# Patient Record
Sex: Female | Born: 1955
Health system: Southern US, Community
[De-identification: ages and names within clinical notes are randomized; demographics above are authoritative.]

## PROBLEM LIST (undated history)

## (undated) DIAGNOSIS — L120 Bullous pemphigoid: Secondary | ICD-10-CM

## (undated) DIAGNOSIS — J302 Other seasonal allergic rhinitis: Secondary | ICD-10-CM

## (undated) DIAGNOSIS — L109 Pemphigus, unspecified: Secondary | ICD-10-CM

## (undated) DIAGNOSIS — Z833 Family history of diabetes mellitus: Secondary | ICD-10-CM

## (undated) DIAGNOSIS — I10 Essential (primary) hypertension: Secondary | ICD-10-CM

## (undated) DIAGNOSIS — M1990 Unspecified osteoarthritis, unspecified site: Secondary | ICD-10-CM

## (undated) DIAGNOSIS — E039 Hypothyroidism, unspecified: Secondary | ICD-10-CM

## (undated) DIAGNOSIS — N906 Unspecified hypertrophy of vulva: Secondary | ICD-10-CM

## (undated) DIAGNOSIS — Z6841 Body Mass Index (BMI) 40.0 and over, adult: Secondary | ICD-10-CM

## (undated) HISTORY — DX: Other seasonal allergic rhinitis: J30.2

## (undated) HISTORY — DX: Body Mass Index (BMI) 40.0 and over, adult: Z684

## (undated) HISTORY — DX: Family history of diabetes mellitus: Z83.3

## (undated) HISTORY — DX: Pemphigus, unspecified: L10.9

## (undated) HISTORY — DX: Hypothyroidism, unspecified: E03.9

## (undated) HISTORY — PX: TONSILLECTOMY: SUR1361

## (undated) HISTORY — DX: Bullous pemphigoid: L12.0

## (undated) HISTORY — DX: Essential (primary) hypertension: I10

## (undated) HISTORY — DX: Unspecified osteoarthritis, unspecified site: M19.90

## (undated) HISTORY — DX: Morbid (severe) obesity due to excess calories: E66.01

---
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
## 2004-08-29 ENCOUNTER — Encounter: Admission: RE | Admit: 2004-08-29 | Discharge: 2004-08-29 | Payer: Self-pay | Admitting: Obstetrics and Gynecology

## 2004-09-06 ENCOUNTER — Encounter: Admission: RE | Admit: 2004-09-06 | Discharge: 2004-09-06 | Payer: Self-pay | Admitting: Obstetrics and Gynecology

## 2006-04-30 ENCOUNTER — Other Ambulatory Visit: Admission: RE | Admit: 2006-04-30 | Discharge: 2006-04-30 | Payer: Self-pay | Admitting: Family Medicine

## 2006-05-12 ENCOUNTER — Encounter: Admission: RE | Admit: 2006-05-12 | Discharge: 2006-05-12 | Payer: Self-pay | Admitting: Family Medicine

## 2011-07-14 ENCOUNTER — Other Ambulatory Visit: Payer: Self-pay | Admitting: Family Medicine

## 2011-07-14 DIAGNOSIS — Z1231 Encounter for screening mammogram for malignant neoplasm of breast: Secondary | ICD-10-CM

## 2011-07-25 ENCOUNTER — Ambulatory Visit
Admission: RE | Admit: 2011-07-25 | Discharge: 2011-07-25 | Disposition: A | Discharge status: Home / Self Care | Payer: 59 | Source: Ambulatory Visit | Attending: Family Medicine | Admitting: Family Medicine

## 2011-07-25 DIAGNOSIS — Z1231 Encounter for screening mammogram for malignant neoplasm of breast: Secondary | ICD-10-CM

## 2012-07-02 ENCOUNTER — Other Ambulatory Visit: Payer: Self-pay | Admitting: Family Medicine

## 2012-07-02 DIAGNOSIS — Z1231 Encounter for screening mammogram for malignant neoplasm of breast: Secondary | ICD-10-CM

## 2012-07-30 ENCOUNTER — Ambulatory Visit
Admission: RE | Admit: 2012-07-30 | Discharge: 2012-07-30 | Disposition: A | Discharge status: Home / Self Care | Payer: 59 | Source: Ambulatory Visit | Attending: Family Medicine | Admitting: Family Medicine

## 2012-07-30 DIAGNOSIS — Z1231 Encounter for screening mammogram for malignant neoplasm of breast: Secondary | ICD-10-CM

## 2013-07-26 ENCOUNTER — Other Ambulatory Visit: Payer: Self-pay

## 2013-07-26 DIAGNOSIS — Z1231 Encounter for screening mammogram for malignant neoplasm of breast: Secondary | ICD-10-CM

## 2013-08-03 ENCOUNTER — Ambulatory Visit
Admission: RE | Admit: 2013-08-03 | Discharge: 2013-08-03 | Disposition: A | Discharge status: Home / Self Care | Payer: 59 | Source: Ambulatory Visit

## 2013-08-03 DIAGNOSIS — Z1231 Encounter for screening mammogram for malignant neoplasm of breast: Secondary | ICD-10-CM

## 2016-06-10 ENCOUNTER — Other Ambulatory Visit: Payer: Self-pay | Admitting: Family Medicine

## 2016-06-10 ENCOUNTER — Ambulatory Visit
Admission: RE | Admit: 2016-06-10 | Discharge: 2016-06-10 | Disposition: A | Discharge status: Home / Self Care | Payer: BLUE CROSS/BLUE SHIELD | Source: Ambulatory Visit | Attending: Family Medicine | Admitting: Family Medicine

## 2016-06-10 DIAGNOSIS — M25552 Pain in left hip: Secondary | ICD-10-CM

## 2016-07-04 DIAGNOSIS — N906 Unspecified hypertrophy of vulva: Secondary | ICD-10-CM | POA: Insufficient documentation

## 2016-07-04 HISTORY — DX: Unspecified hypertrophy of vulva: N90.60

## 2017-08-29 IMAGING — CR DG HIP (WITH OR WITHOUT PELVIS) 2-3V*L*
2 series · 2 of 2 positions shown · non-contrast
Comparison: None in PACs

CLINICAL DATA: Chronic left hip pain for the past year with
increased symptoms over the past 2 weeks without known trauma.

EXAM:
DG HIP (WITH OR WITHOUT PELVIS) 2-3V LEFT

[w hip ap left]
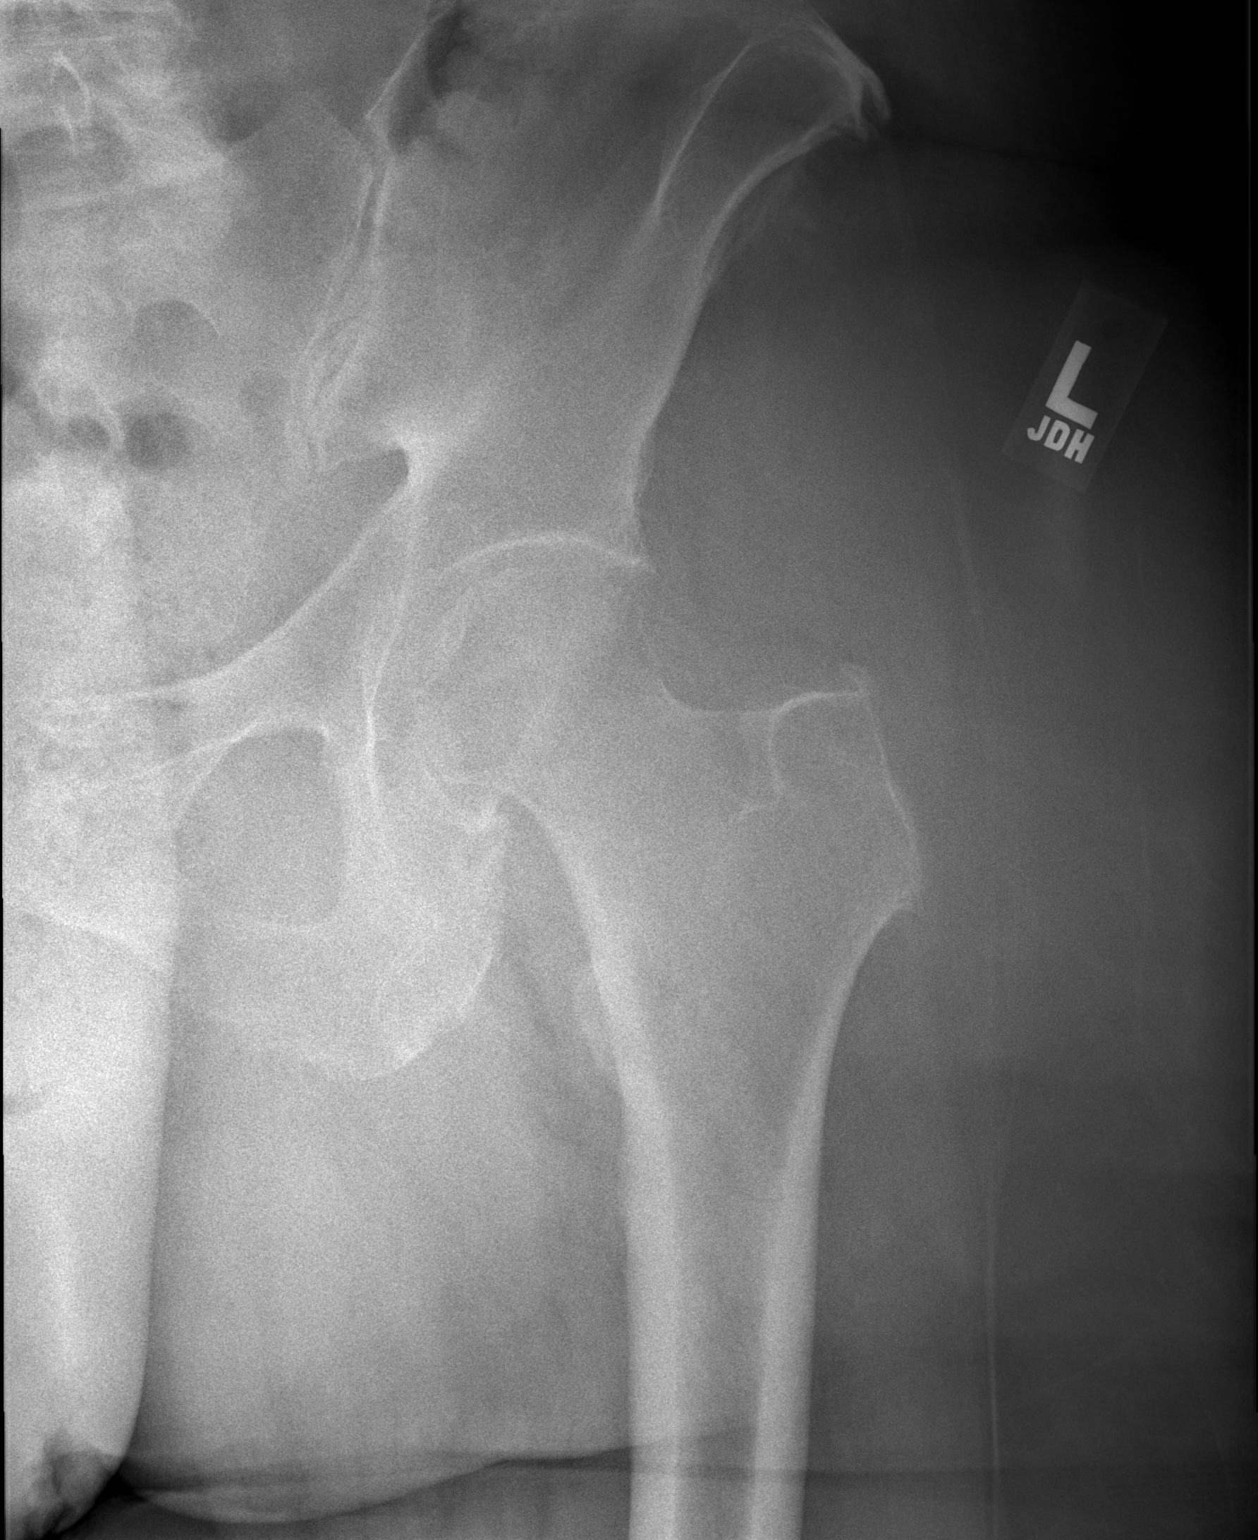

[w hip lat left]
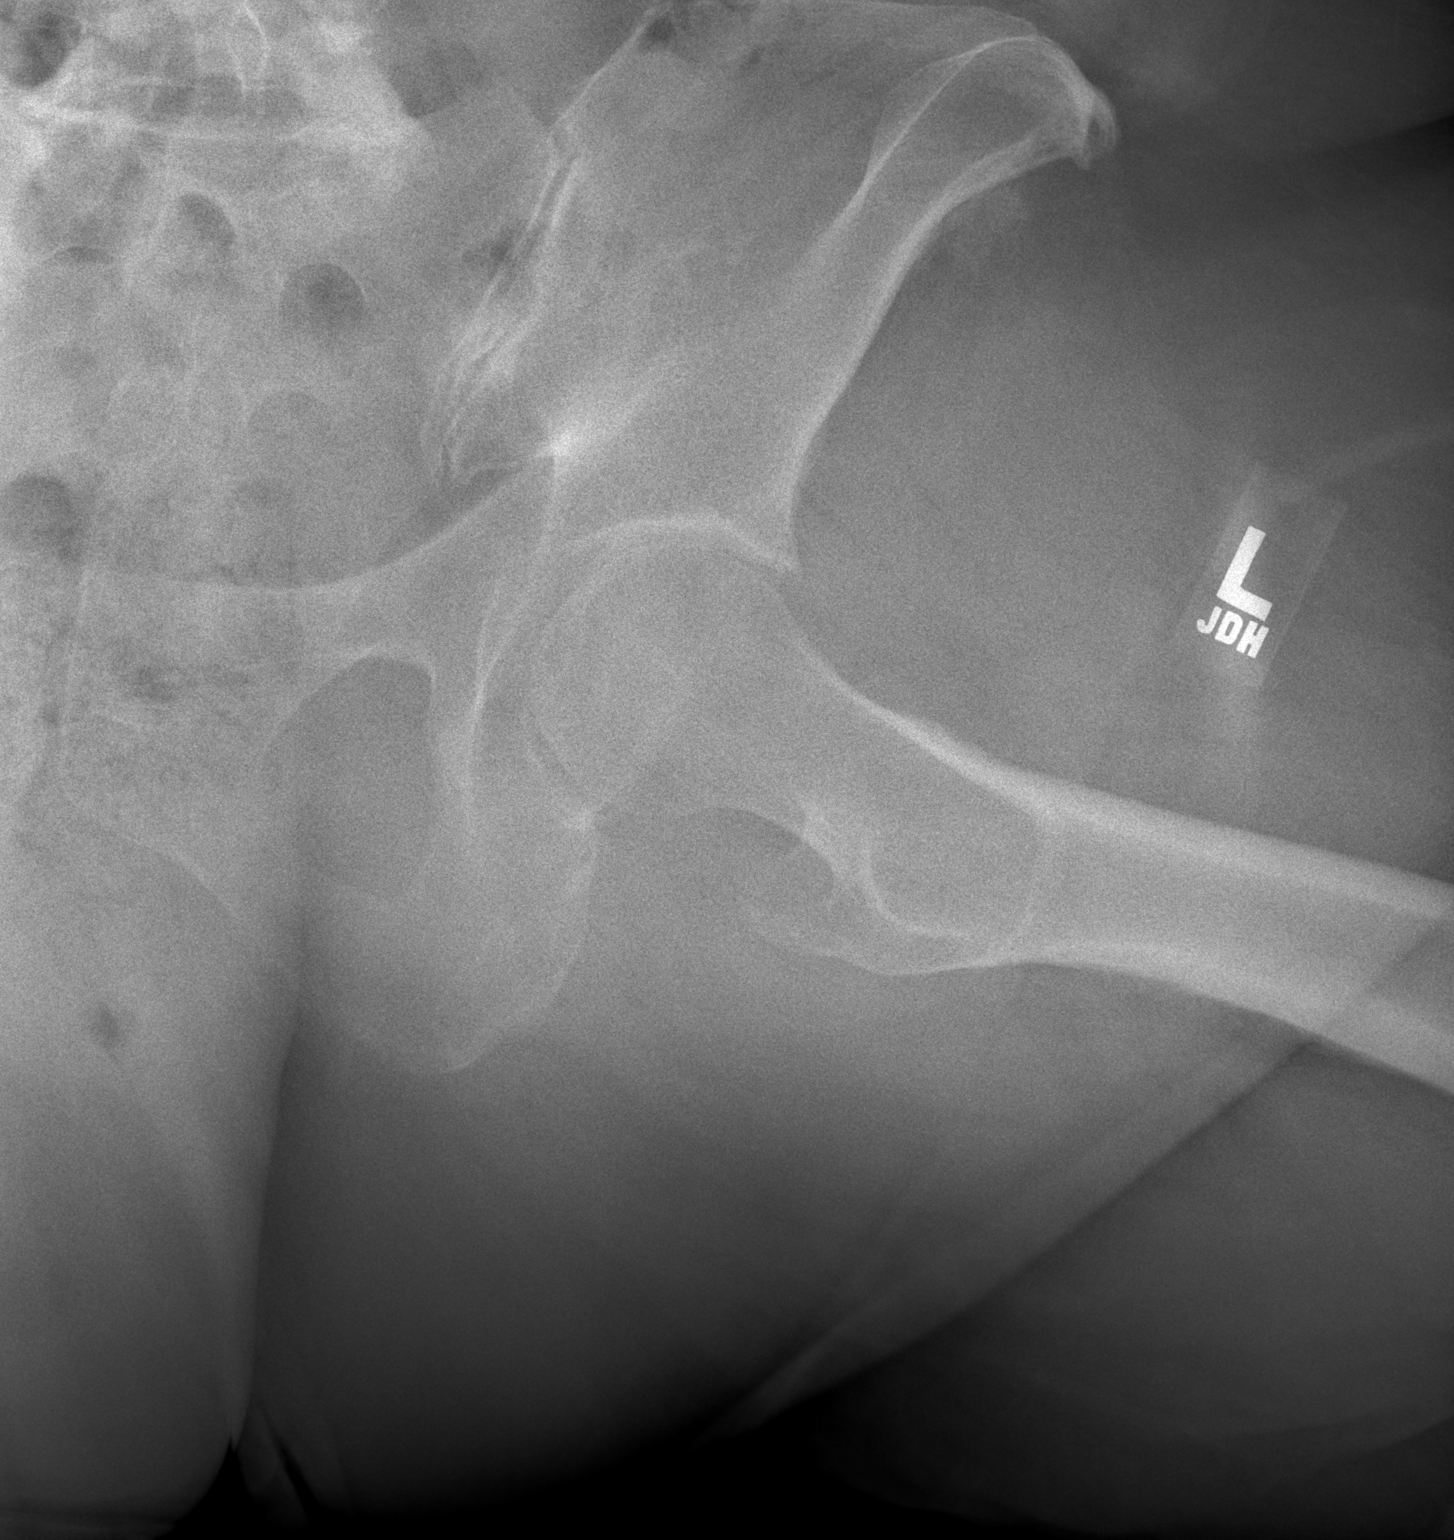

[2 of 2 positions shown; findings below may reference images not displayed]

FINDINGS: The bones are subjectively osteopenic. The observed portions of the
left hemipelvis exhibit no acute abnormalities. The left hip joint
space is preserved. The articular surfaces of the femoral head and
acetabulum remains smoothly rounded. The femoral neck,
intertrochanteric, and subtrochanteric regions are normal.
IMPRESSION: There is no acute or significant chronic bony abnormality of the
left hip.

## 2018-10-18 DIAGNOSIS — Z124 Encounter for screening for malignant neoplasm of cervix: Secondary | ICD-10-CM | POA: Diagnosis not present

## 2018-10-18 DIAGNOSIS — Z01419 Encounter for gynecological examination (general) (routine) without abnormal findings: Secondary | ICD-10-CM | POA: Diagnosis not present

## 2018-10-18 DIAGNOSIS — Z1231 Encounter for screening mammogram for malignant neoplasm of breast: Secondary | ICD-10-CM | POA: Diagnosis not present

## 2019-05-27 ENCOUNTER — Telehealth: Payer: Self-pay

## 2019-05-27 NOTE — Telephone Encounter (Signed)
Left message for patient to call office back to establish care from one of our providers. This is in reference to a referral that was sent by Dr. Daiva Eves.

## 2019-06-02 ENCOUNTER — Telehealth: Payer: Self-pay

## 2019-06-02 NOTE — Telephone Encounter (Signed)
Left voicemail to give the office a call to make appointment.

## 2019-07-07 ENCOUNTER — Encounter: Payer: Self-pay | Admitting: Cardiology

## 2019-07-07 DIAGNOSIS — M1990 Unspecified osteoarthritis, unspecified site: Secondary | ICD-10-CM | POA: Insufficient documentation

## 2019-07-07 DIAGNOSIS — E039 Hypothyroidism, unspecified: Secondary | ICD-10-CM | POA: Insufficient documentation

## 2019-07-07 DIAGNOSIS — J302 Other seasonal allergic rhinitis: Secondary | ICD-10-CM | POA: Insufficient documentation

## 2019-07-12 ENCOUNTER — Encounter: Payer: Self-pay | Admitting: Cardiology

## 2019-07-12 ENCOUNTER — Encounter: Payer: Self-pay | Admitting: *Deleted

## 2019-07-12 ENCOUNTER — Other Ambulatory Visit: Payer: Self-pay | Admitting: *Deleted

## 2019-07-13 ENCOUNTER — Ambulatory Visit (INDEPENDENT_AMBULATORY_CARE_PROVIDER_SITE_OTHER): Payer: BC Managed Care – PPO | Admitting: Cardiology

## 2019-07-13 ENCOUNTER — Encounter: Payer: Self-pay | Admitting: Cardiology

## 2019-07-13 ENCOUNTER — Other Ambulatory Visit: Payer: Self-pay

## 2019-07-13 VITALS — BP 120/72 | HR 84 | Temp 97.5°F | Ht 63.0 in | Wt 245.0 lb

## 2019-07-13 DIAGNOSIS — I10 Essential (primary) hypertension: Secondary | ICD-10-CM | POA: Insufficient documentation

## 2019-07-13 NOTE — Patient Instructions (Signed)
Medication Instructions:  Your physician recommends that you continue on your current medications as directed. Please refer to the Current Medication list given to you today.  If you need a refill on your cardiac medications before your next appointment, please call your pharmacy.   Lab work: NONE If you have labs (blood work) drawn today and your tests are completely normal, you will receive your results only by: Marland Kitchen MyChart Message (if you have MyChart) OR . A paper copy in the mail If you have any lab test that is abnormal or we need to change your treatment, we will call you to review the results.  Testing/Procedures: You had an EKG performed today  Your physician has requested that you have an echocardiogram. Echocardiography is a painless test that uses sound waves to create images of your heart. It provides your doctor with information about the size and shape of your heart and how well your heart's chambers and valves are working. This procedure takes approximately one hour. There are no restrictions for this procedure.  You have been referred for CT Calcium score. You will be contacted to schedule this procedure at [address], [address]. [place], [place] . There is an $150 charge due at time of service.  Follow-Up: At [facility], you and your health needs are our priority.  As part of our continuing mission to provide you with exceptional heart care, we have created designated Provider Care Teams.  These Care Teams include your primary Cardiologist (physician) and Advanced Practice Providers (APPs -  Physician Assistants and Nurse Practitioners) who all work together to provide you with the care you need, when you need it. You will need a follow up appointment in 6 months.    Any Other Special Instructions Will Be Listed Below   Echocardiogram An echocardiogram is a procedure that uses painless sound waves (ultrasound) to produce an image of the heart. Images from an  echocardiogram can provide important information about:  Signs of coronary artery disease (CAD).  Aneurysm detection. An aneurysm is a weak or damaged part of an artery wall that bulges out from the normal force of blood pumping through the body.  Heart size and shape. Changes in the size or shape of the heart can be associated with certain conditions, including heart failure, aneurysm, and CAD.  Heart muscle function.  Heart valve function.  Signs of a past heart attack.  Fluid buildup around the heart.  Thickening of the heart muscle.  A tumor or infectious growth around the heart valves. Tell a health care provider about:  Any allergies you have.  All medicines you are taking, including vitamins, herbs, eye drops, creams, and over-the-counter medicines.  Any blood disorders you have.  Any surgeries you have had.  Any medical conditions you have.  Whether you are pregnant or may be pregnant. What are the risks? Generally, this is a safe procedure. However, problems may occur, including:  Allergic reaction to dye (contrast) that may be used during the procedure. What happens before the procedure? No specific preparation is needed. You may eat and drink normally. What happens during the procedure?   An IV tube may be inserted into one of your veins.  You may receive contrast through this tube. A contrast is an injection that improves the quality of the pictures from your heart.  A gel will be applied to your chest.  A wand-like tool (transducer) will be moved over your chest. The gel will help to  transmit the sound waves from the transducer.  The sound waves will harmlessly bounce off of your heart to allow the heart images to be captured in real-time motion. The images will be recorded on a computer. The procedure may vary among health care providers and hospitals. What happens after the procedure?  You may return to your normal, everyday life, including diet,  activities, and medicines, unless your health care provider tells you not to do that. Summary  An echocardiogram is a procedure that uses painless sound waves (ultrasound) to produce an image of the heart.  Images from an echocardiogram can provide important information about the size and shape of your heart, heart muscle function, heart valve function, and fluid buildup around your heart.  You do not need to do anything to prepare before this procedure. You may eat and drink normally.  After the echocardiogram is completed, you may return to your normal, everyday life, unless your health care provider tells you not to do that. This information is not intended to replace advice given to you by your health care provider. Make sure you discuss any questions you have with your health care provider. Document Released: 10/03/2000 Document Revised: 01/27/2019 Document Reviewed: 11/08/2016 Elsevier Patient Education  2020 [employer] Inc[employer]  Coronary Calcium Scan A coronary calcium scan is an imaging test used to look for deposits of calcium and other fatty materials (plaques) in the inner lining of the blood vessels of the heart (coronary arteries). These deposits of calcium and plaques can partly clog and narrow the coronary arteries without producing any symptoms or warning signs. This puts a person at risk for a heart attack. This test can detect these deposits before symptoms develop. Tell a health care provider about:  Any allergies you have.  All medicines you are taking, including vitamins, herbs, eye drops, creams, and over-the-counter medicines.  Any problems you or family members have had with anesthetic medicines.  Any blood disorders you have.  Any surgeries you have had.  Any medical conditions you have.  Whether you are pregnant or may be pregnant. What are the risks? Generally, this is a safe procedure. However, problems may occur, including:  Harm to a pregnant woman and her  unborn baby. This test involves the use of radiation. Radiation exposure can be dangerous to a pregnant woman and her unborn baby. If you are pregnant, you generally should not have this procedure done.  Slight increase in the risk of cancer. This is because of the radiation involved in the test. What happens before the procedure? No preparation is needed for this procedure. What happens during the procedure?   You will undress and remove any jewelry around your neck or chest.  You will put on a hospital gown.  Sticky electrodes will be placed on your chest. The electrodes will be connected to an electrocardiogram (ECG) machine to record a tracing of the electrical activity of your heart.  A CT scanner will take pictures of your heart. During this time, you will be asked to lie still and hold your breath for 2-3 seconds while a picture of your heart is being taken. The procedure may vary among health care providers and hospitals. What happens after the procedure?  You can get dressed.  You can return to your normal activities.  It is up to you to get the results of your test. Ask your health care provider, or the department that is doing the test, when your results will be ready. Summary  A coronary calcium scan is an imaging test used to look for deposits of calcium and other fatty materials (plaques) in the inner lining of the blood vessels of the heart (coronary arteries).  Generally, this is a safe procedure. Tell your health care provider if you are pregnant or may be pregnant.  No preparation is needed for this procedure.  A CT scanner will take pictures of your heart.  You can return to your normal activities after the scan is done. This information is not intended to replace advice given to you by your health care provider. Make sure you discuss any questions you have with your health care provider. Document Released: 04/03/2008 Document Revised: 09/18/2017 Document  Reviewed: 08/25/2016 Elsevier Patient Education  2020 [employer].

## 2019-07-13 NOTE — Progress Notes (Signed)
Cardiology Office Note:    Date:  07/13/2019   ID:  [name], DOB 1956-05-21, MRN [id number]  PCP:  Ailene Ravel, MD  Cardiologist:  Garwin Brothers, MD   Referring MD: Ailene Ravel, MD    ASSESSMENT:    1. Essential hypertension   2. Morbid obesity (HCC)    PLAN:    In order of problems listed above:  1. Essential hypertension: Primary prevention stressed with the patient.  Importance of compliance with diet and medication stressed and she vocalized understanding.  Her blood pressure stable.  Diet was discussed for obesity and risks of obesity explained and she vocalized understanding.  Echocardiogram will be done to assess murmur heard on auscultation. 2. Her lipids are not terribly bad.  Diet was discussed with her at length.  I reviewed them. 3. In view of the stratification I would like to do a CT calcium score to help understand her risk for coronary events.  This is what she is really interested in. 4. Patient will be seen in follow-up appointment in 6 months or earlier if the patient has any concerns.  Patient was advised to exercise at least 30 minutes a day at least 5 days a week and she promises to do so.    Medication Adjustments/Labs and Tests Ordered: Current medicines are reviewed at length with the patient today.  Concerns regarding medicines are outlined above.  Orders Placed This Encounter  Procedures  . CT CARDIAC SCORING  . EKG 12-Lead  . ECHOCARDIOGRAM COMPLETE   No orders of the defined types were placed in this encounter.    Chief Complaint  Patient presents with  . Shortness of Breath    Family Hx     History of Present Illness:    Kelli Liu is a 63 y.o. female.  Patient has past medical history of essential hypertension and morbid obesity.  She has family history of coronary artery disease and is concerned and wants to be evaluated.  She denies any chest pain orthopnea or PND.  She tells me that she walks a mile a day without any  problems she does that 4 times a week.  At the time of my evaluation, the patient is alert awake oriented and in no distress.  Past Medical History:  Diagnosis Date  . Hypertension   . Hypertrophy of vulva 07/04/2016  . Hypothyroidism   . Osteoarthritis    Unspecified  . Seasonal allergies     Past Surgical History:  Procedure Laterality Date  . TONSILLECTOMY      Current Medications: Current Meds  Medication Sig  . lisinopril-hydrochlorothiazide (ZESTORETIC) 20-25 MG tablet Take 1 tablet by mouth daily.  Marland Kitchen thyroid (ARMOUR) 60 MG tablet Take 60 mg by mouth daily before breakfast.  . [DISCONTINUED] hydrochlorothiazide (HYDRODIURIL) 25 MG tablet hydrochlorothiazide 25 mg tablet  TK ONE T PO QD     Allergies:   Patient has no known allergies.   Social History   Socioeconomic History  . Marital status: Single    Spouse name: Not on file  . Number of children: Not on file  . Years of education: Not on file  . Highest education level: Not on file  Occupational History  . Not on file  Social Needs  . Financial resource strain: Not on file  . Food insecurity    Worry: Not on file    Inability: Not on file  . Transportation needs    Medical: Not on file  Non-medical: Not on file  Tobacco Use  . Smoking status: Former Smoker    Quit date: 07/06/2009    Years since quitting: 10.0  . Smokeless tobacco: Never Used  Substance and Sexual Activity  . Alcohol use: Not on file  . Drug use: Not on file  . Sexual activity: Not on file  Lifestyle  . Physical activity    Days per week: Not on file    Minutes per session: Not on file  . Stress: Not on file  Relationships  . Social Herbalist on phone: Not on file    Gets together: Not on file    Attends religious service: Not on file    Active member of club or organization: Not on file    Attends meetings of clubs or organizations: Not on file    Relationship status: Not on file  Other Topics Concern  . Not on  file  Social History Narrative  . Not on file     Family History: The patient's family history includes CAD in her brother and mother; Diabetes in her brother; Heart attack in her brother; Hypertension in her mother; Renal Disease in her brother; Renal cancer in her father.  ROS:   Please see the history of present illness.    All other systems reviewed and are negative.  EKGs/Labs/Other Studies Reviewed:    The following studies were reviewed today: EKG reveals sinus rhythm and nonspecific ST-T changes.   Recent Labs: No results found for requested labs within last 8760 hours.  Recent Lipid Panel No results found for: CHOL, TRIG, HDL, CHOLHDL, VLDL, LDLCALC, LDLDIRECT  Physical Exam:    VS:  BP 120/72 (BP Location: Left Arm, Patient Position: Sitting, Cuff Size: Normal)   Pulse 84   Temp (!) 97.5 F (36.4 C)   Ht 5\' 3"  (1.6 m)   Wt 245 lb (111.1 kg)   SpO2 100%   BMI 43.40 kg/m     Wt Readings from Last 3 Encounters:  07/13/19 245 lb (111.1 kg)  07/12/19 263 lb (119.3 kg)  05/12/19 238 lb (108 kg)     GEN: Patient is in no acute distress HEENT: Normal NECK: No JVD; No carotid bruits LYMPHATICS: No lymphadenopathy CARDIAC: Hear sounds regular, 2/6 systolic murmur at the apex. RESPIRATORY:  Clear to auscultation without rales, wheezing or rhonchi  ABDOMEN: Soft, non-tender, non-distended MUSCULOSKELETAL:  No edema; No deformity  SKIN: Warm and dry NEUROLOGIC:  Alert and oriented x 3 PSYCHIATRIC:  Normal affect   Signed, Jenean Lindau, MD  07/13/2019 11:15 AM    [place]

## 2019-07-18 ENCOUNTER — Ambulatory Visit (HOSPITAL_BASED_OUTPATIENT_CLINIC_OR_DEPARTMENT_OTHER)
Admission: RE | Admit: 2019-07-18 | Discharge: 2019-07-18 | Disposition: A | Discharge status: Home / Self Care | Payer: BC Managed Care – PPO | Source: Ambulatory Visit | Attending: Cardiology | Admitting: Cardiology

## 2019-07-18 ENCOUNTER — Other Ambulatory Visit: Payer: Self-pay

## 2019-07-18 DIAGNOSIS — I10 Essential (primary) hypertension: Secondary | ICD-10-CM | POA: Diagnosis not present

## 2019-07-18 NOTE — Progress Notes (Signed)
  Echocardiogram 2D Echocardiogram has been performed.  Cardell Peach 07/18/2019, 2:55 PM

## 2019-07-25 ENCOUNTER — Telehealth: Payer: Self-pay | Admitting: Cardiology

## 2019-07-25 NOTE — Telephone Encounter (Signed)
Patient has not heard results from her recent Echo and would like to discuss.  Please call

## 2019-07-26 NOTE — Telephone Encounter (Signed)
Results relayed, no further questions. Copy sent to Dr. Lisbeth Ply per Dr. Docia Furl request.

## 2019-07-26 NOTE — Telephone Encounter (Signed)
-----   Message from Jenean Lindau, MD sent at 07/19/2019 11:37 AM EDT ----- The results of the study is unremarkable. Please inform patient. I will discuss in detail at next appointment. Cc  primary care/referring physician Jenean Lindau, MD 07/19/2019 11:37 AM

## 2019-08-05 DIAGNOSIS — H5203 Hypermetropia, bilateral: Secondary | ICD-10-CM | POA: Diagnosis not present

## 2019-08-05 DIAGNOSIS — H52223 Regular astigmatism, bilateral: Secondary | ICD-10-CM | POA: Diagnosis not present

## 2019-08-05 DIAGNOSIS — H524 Presbyopia: Secondary | ICD-10-CM | POA: Diagnosis not present

## 2019-08-05 DIAGNOSIS — Z135 Encounter for screening for eye and ear disorders: Secondary | ICD-10-CM | POA: Diagnosis not present

## 2019-08-23 ENCOUNTER — Other Ambulatory Visit: Payer: Self-pay

## 2019-08-23 ENCOUNTER — Ambulatory Visit (INDEPENDENT_AMBULATORY_CARE_PROVIDER_SITE_OTHER)
Admission: RE | Admit: 2019-08-23 | Discharge: 2019-08-23 | Disposition: A | Discharge status: Home / Self Care | Payer: Self-pay | Source: Ambulatory Visit | Attending: Cardiology | Admitting: Cardiology

## 2019-08-23 DIAGNOSIS — I10 Essential (primary) hypertension: Secondary | ICD-10-CM

## 2019-08-31 ENCOUNTER — Telehealth: Payer: Self-pay

## 2019-08-31 NOTE — Telephone Encounter (Signed)
-----   Message from Jenean Lindau, MD sent at 08/24/2019  8:11 AM EST ----- The results of the study is unremarkable. Please inform patient. I will discuss in detail at next appointment. Cc  primary care/referring physician Jenean Lindau, MD 08/24/2019 8:11 AM

## 2019-08-31 NOTE — Telephone Encounter (Signed)
Lmtcb for results, copy sent to PCP.

## 2019-09-01 NOTE — Telephone Encounter (Signed)
2nd message left for patient to call office for results 

## 2019-09-08 NOTE — Telephone Encounter (Signed)
3rd message left for patient.

## 2019-09-14 NOTE — Telephone Encounter (Signed)
3rd message for patient to call office

## 2019-09-23 DIAGNOSIS — H524 Presbyopia: Secondary | ICD-10-CM | POA: Diagnosis not present

## 2019-09-23 DIAGNOSIS — H52223 Regular astigmatism, bilateral: Secondary | ICD-10-CM | POA: Diagnosis not present

## 2019-12-27 DIAGNOSIS — E039 Hypothyroidism, unspecified: Secondary | ICD-10-CM | POA: Diagnosis not present

## 2019-12-27 DIAGNOSIS — I10 Essential (primary) hypertension: Secondary | ICD-10-CM | POA: Diagnosis not present

## 2020-02-01 ENCOUNTER — Telehealth: Payer: Self-pay | Admitting: Cardiology

## 2020-02-01 NOTE — Telephone Encounter (Signed)
New Message    Pt is requesting a call back with CT results     Please call

## 2020-02-02 NOTE — Telephone Encounter (Signed)
Left message to call back  

## 2020-02-13 NOTE — Telephone Encounter (Signed)
Pt called and wanted results from October 2020 CT scan. Results were reviewed and pt had no additional questions.

## 2020-03-26 DIAGNOSIS — L810 Postinflammatory hyperpigmentation: Secondary | ICD-10-CM | POA: Diagnosis not present

## 2020-03-26 DIAGNOSIS — L821 Other seborrheic keratosis: Secondary | ICD-10-CM | POA: Diagnosis not present

## 2020-03-26 DIAGNOSIS — L814 Other melanin hyperpigmentation: Secondary | ICD-10-CM | POA: Diagnosis not present

## 2020-03-26 DIAGNOSIS — X32XXXS Exposure to sunlight, sequela: Secondary | ICD-10-CM | POA: Diagnosis not present

## 2020-04-17 DIAGNOSIS — E039 Hypothyroidism, unspecified: Secondary | ICD-10-CM | POA: Diagnosis not present

## 2020-04-17 DIAGNOSIS — M1711 Unilateral primary osteoarthritis, right knee: Secondary | ICD-10-CM | POA: Diagnosis not present

## 2020-04-17 DIAGNOSIS — I10 Essential (primary) hypertension: Secondary | ICD-10-CM | POA: Diagnosis not present

## 2020-04-17 DIAGNOSIS — M25561 Pain in right knee: Secondary | ICD-10-CM | POA: Diagnosis not present

## 2020-04-17 DIAGNOSIS — Z13220 Encounter for screening for lipoid disorders: Secondary | ICD-10-CM | POA: Diagnosis not present

## 2020-04-17 DIAGNOSIS — Z0000 Encounter for general adult medical examination without abnormal findings: Secondary | ICD-10-CM | POA: Diagnosis not present

## 2020-04-17 DIAGNOSIS — Z6841 Body Mass Index (BMI) 40.0 and over, adult: Secondary | ICD-10-CM | POA: Diagnosis not present

## 2020-07-26 DIAGNOSIS — Z1231 Encounter for screening mammogram for malignant neoplasm of breast: Secondary | ICD-10-CM | POA: Diagnosis not present

## 2020-10-29 DIAGNOSIS — Z1152 Encounter for screening for COVID-19: Secondary | ICD-10-CM | POA: Diagnosis not present

## 2020-11-10 IMAGING — CT CT HEART SCORING
2 series · 16 of 20 positions shown, 18 images · non-contrast
Comparison: None.
COMPARISON: None.

Addendum:
EXAM:
OVER-READ INTERPRETATION  CT CHEST

The following report is an over-read performed by radiologist Dr.
Nenem Viena [REDACTED] on 08/23/2019. This
over-read does not include interpretation of cardiac or coronary
anatomy or pathology. The coronary calcium score interpretation by
the cardiologist is attached.
CLINICAL DATA: Risk stratification
Coronary Calcium Score
TECHNIQUE: The patient was scanned on a Siemens Force scanner. Axial
non-contrast 3 mm slices were carried out through the heart. The
data set was analyzed on a dedicated work station and scored using
the Agatson method.

[Series 2: casc 3.0 i36f 2 bestdiast 67 % · axial · 0.31mm/px · z∈[-283,-175]mm · 8 of 48 slices shown, 10 images]
[im 6/48  vessel]
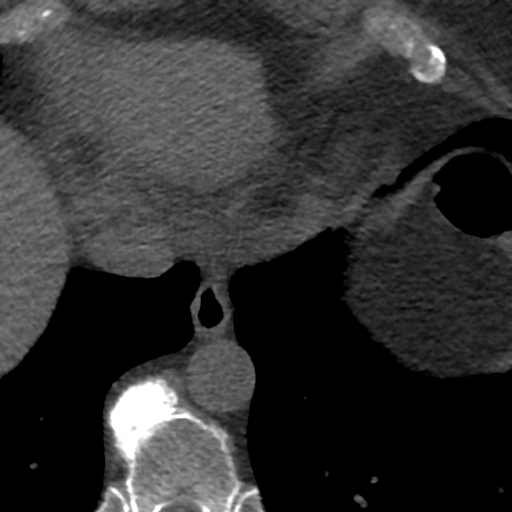
[im 6/48  lung]
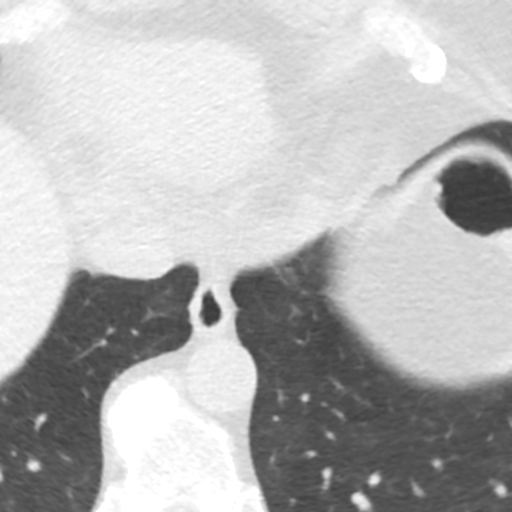
[im 11/48  vessel]
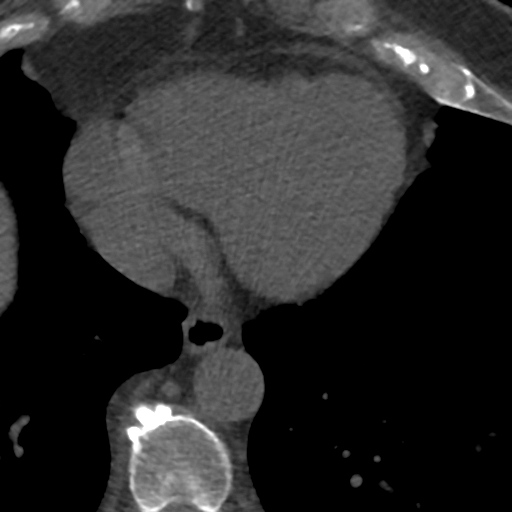
[im 16/48  vessel]
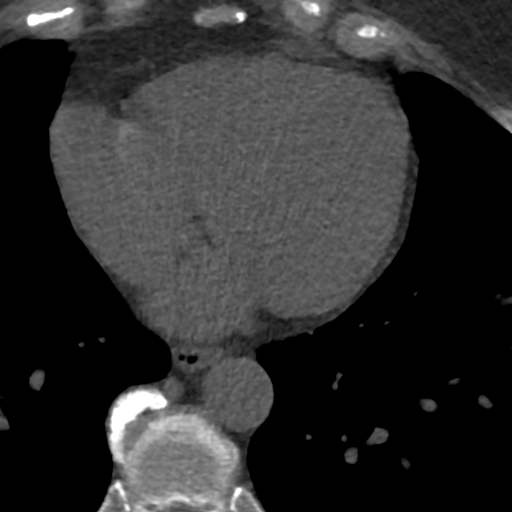
[im 21/48  vessel]
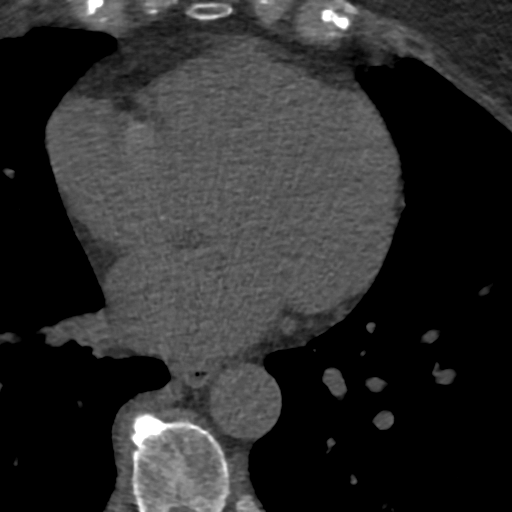
[im 27/48  vessel]
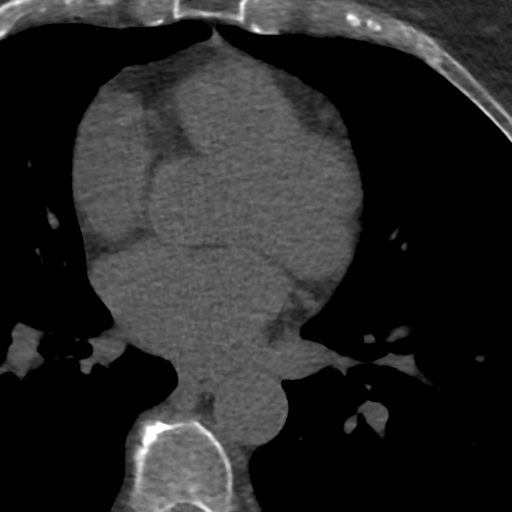
[im 27/48  lung]
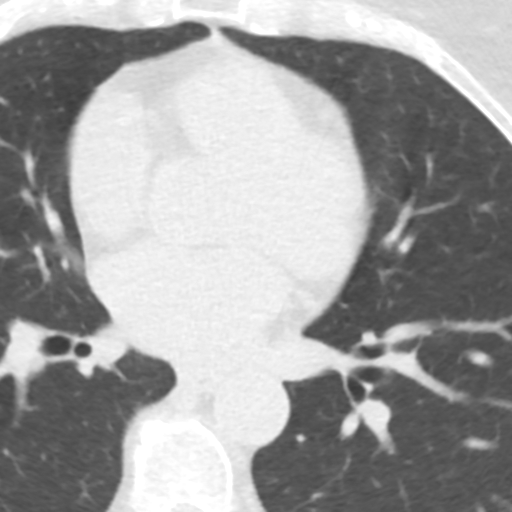
[im 32/48  vessel]
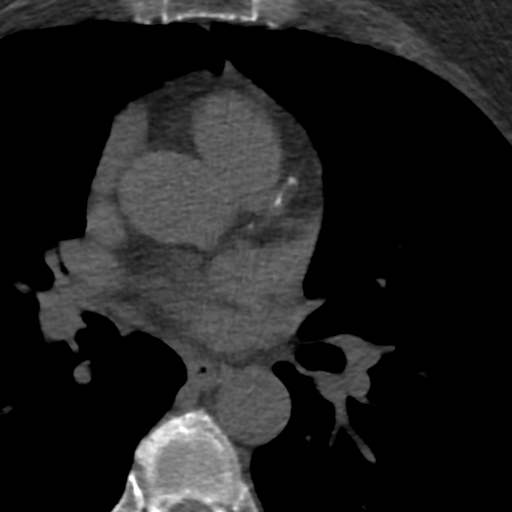
[im 37/48  vessel]
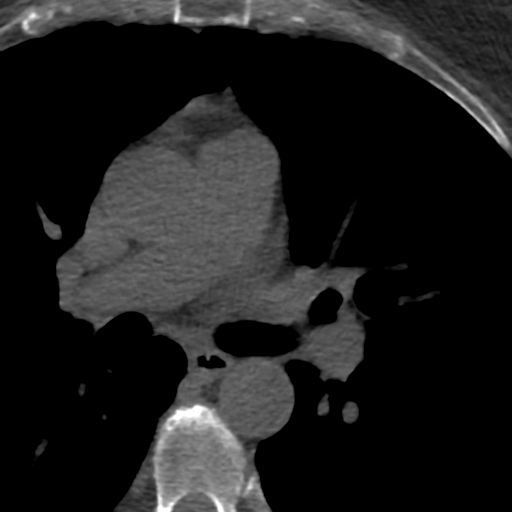
[im 42/48  vessel]
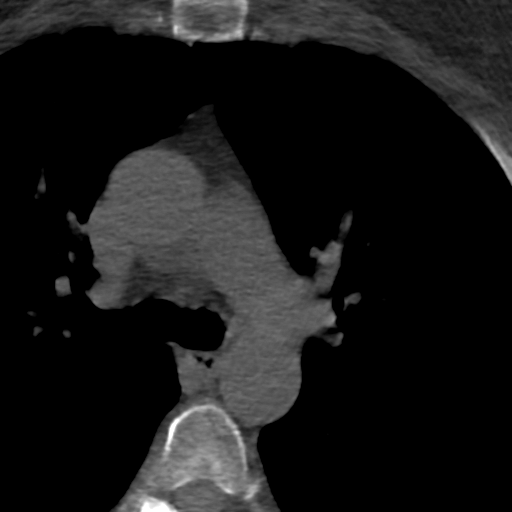

[Series 4: lung st 70 % · axial · 0.68mm/px · z∈[-283,-175]mm · 8 of 48 slices shown]
[im 6/48  lung]
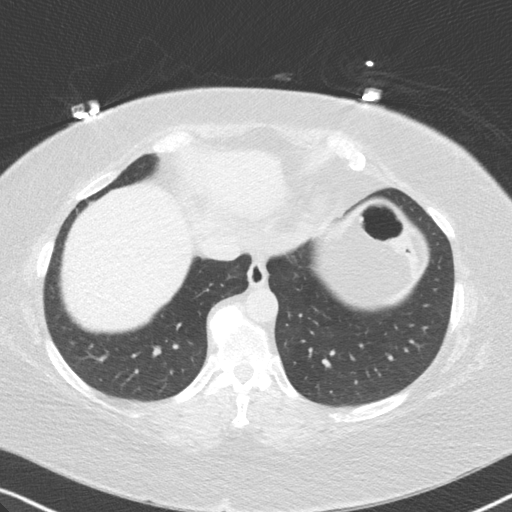
[im 11/48  lung]
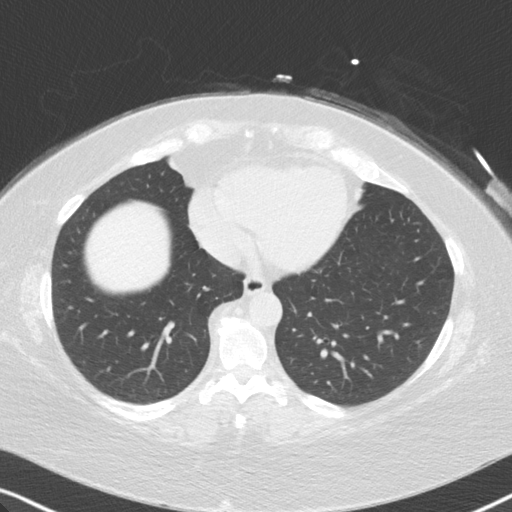
[im 16/48  lung]
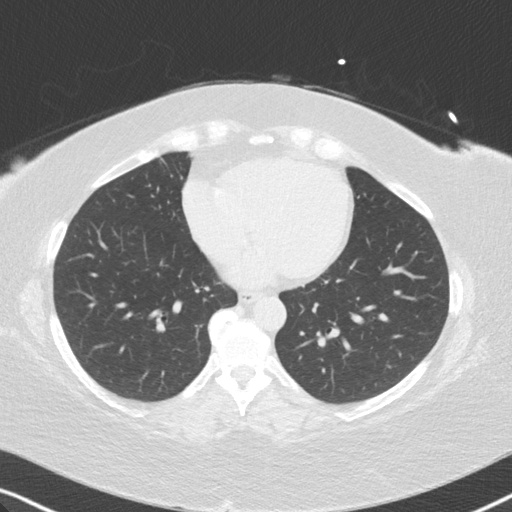
[im 21/48  lung]
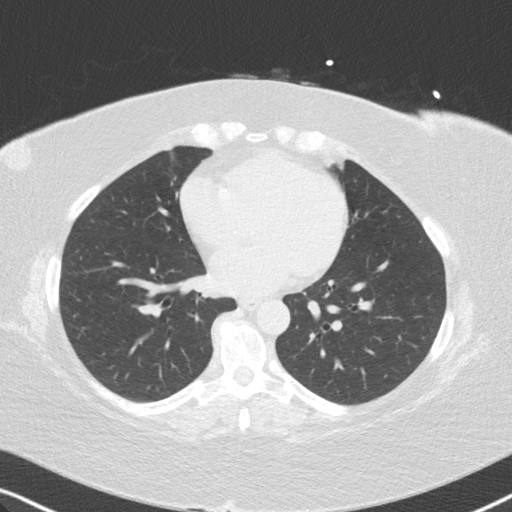
[im 27/48  lung]
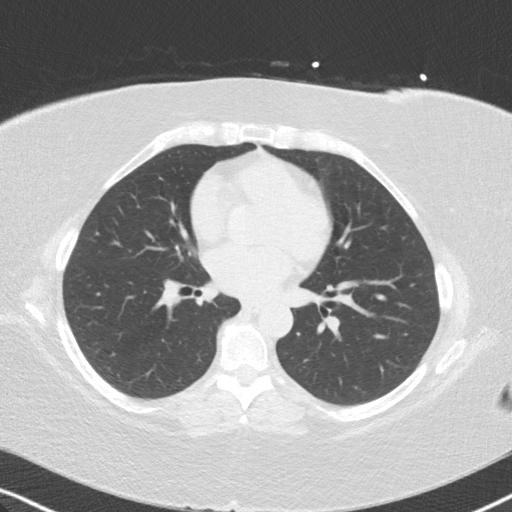
[im 32/48  lung]
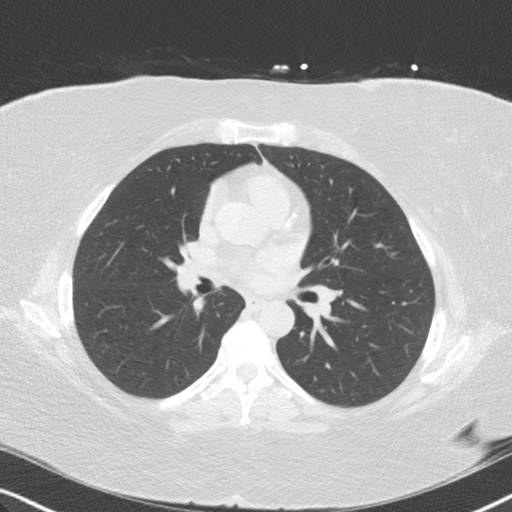
[im 37/48  lung]
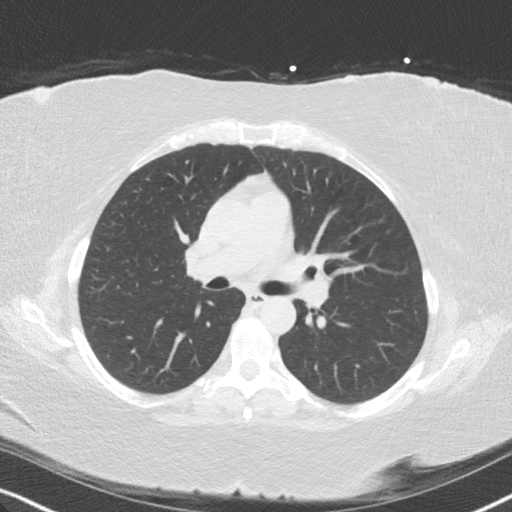
[im 42/48  lung]
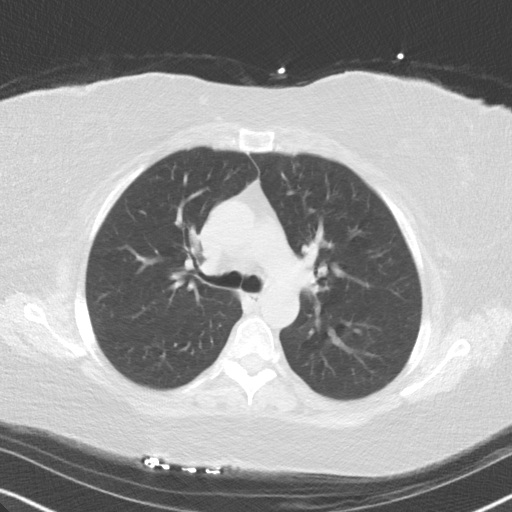

[16 of 20 positions shown; findings below may reference images not displayed]

FINDINGS: Limited view of the lung parenchyma demonstrates no suspicious
nodularity. Airways are normal.

Limited view of the mediastinum demonstrates no adenopathy.
Esophagus normal.

Limited view of the upper abdomen unremarkable.

Limited view of the skeleton and chest wall is unremarkable.
IMPRESSION: No significant extracardiac findings.
FINDINGS: Non-cardiac: See separate report from [REDACTED].

Ascending Aorta: Normal Caliber.  Mildly calcified.

Pericardium: Normal

Coronary arteries: Normal coronary origins. Coronary calcifications
in the LAD.
IMPRESSION: Coronary calcium score of 29. This was 72nd percentile for age and
sex matched control.

Nazareth Jumper

*** End of Addendum ***
EXAM:
OVER-READ INTERPRETATION  CT CHEST

The following report is an over-read performed by radiologist Dr.
Nenem Viena [REDACTED] on 08/23/2019. This
over-read does not include interpretation of cardiac or coronary
anatomy or pathology. The coronary calcium score interpretation by
the cardiologist is attached.
FINDINGS: Limited view of the lung parenchyma demonstrates no suspicious
nodularity. Airways are normal.

Limited view of the mediastinum demonstrates no adenopathy.
Esophagus normal.

Limited view of the upper abdomen unremarkable.

Limited view of the skeleton and chest wall is unremarkable.
IMPRESSION: No significant extracardiac findings.

## 2021-05-13 DIAGNOSIS — I10 Essential (primary) hypertension: Secondary | ICD-10-CM | POA: Diagnosis not present

## 2021-05-13 DIAGNOSIS — Z23 Encounter for immunization: Secondary | ICD-10-CM | POA: Diagnosis not present

## 2021-05-13 DIAGNOSIS — E039 Hypothyroidism, unspecified: Secondary | ICD-10-CM | POA: Diagnosis not present

## 2021-05-13 DIAGNOSIS — Z0000 Encounter for general adult medical examination without abnormal findings: Secondary | ICD-10-CM | POA: Diagnosis not present

## 2021-11-12 DIAGNOSIS — K121 Other forms of stomatitis: Secondary | ICD-10-CM | POA: Diagnosis not present

## 2021-11-12 DIAGNOSIS — I10 Essential (primary) hypertension: Secondary | ICD-10-CM | POA: Diagnosis not present

## 2021-11-12 DIAGNOSIS — L309 Dermatitis, unspecified: Secondary | ICD-10-CM | POA: Diagnosis not present

## 2021-11-12 DIAGNOSIS — J3489 Other specified disorders of nose and nasal sinuses: Secondary | ICD-10-CM | POA: Diagnosis not present

## 2021-11-12 DIAGNOSIS — E039 Hypothyroidism, unspecified: Secondary | ICD-10-CM | POA: Diagnosis not present

## 2021-12-03 ENCOUNTER — Ambulatory Visit: Payer: BC Managed Care – PPO | Admitting: Medical

## 2021-12-03 DIAGNOSIS — K120 Recurrent oral aphthae: Secondary | ICD-10-CM | POA: Diagnosis not present

## 2021-12-31 DIAGNOSIS — L109 Pemphigus, unspecified: Secondary | ICD-10-CM | POA: Diagnosis not present

## 2022-01-21 DIAGNOSIS — L989 Disorder of the skin and subcutaneous tissue, unspecified: Secondary | ICD-10-CM | POA: Diagnosis not present

## 2022-01-21 DIAGNOSIS — L309 Dermatitis, unspecified: Secondary | ICD-10-CM | POA: Diagnosis not present

## 2022-01-21 DIAGNOSIS — R21 Rash and other nonspecific skin eruption: Secondary | ICD-10-CM | POA: Diagnosis not present

## 2022-01-21 DIAGNOSIS — L120 Bullous pemphigoid: Secondary | ICD-10-CM | POA: Diagnosis not present

## 2022-01-21 DIAGNOSIS — D485 Neoplasm of uncertain behavior of skin: Secondary | ICD-10-CM | POA: Diagnosis not present

## 2022-02-04 DIAGNOSIS — L120 Bullous pemphigoid: Secondary | ICD-10-CM | POA: Diagnosis not present

## 2022-02-04 DIAGNOSIS — Z8616 Personal history of COVID-19: Secondary | ICD-10-CM | POA: Diagnosis not present

## 2022-03-03 DIAGNOSIS — Z79899 Other long term (current) drug therapy: Secondary | ICD-10-CM | POA: Diagnosis not present

## 2022-03-03 DIAGNOSIS — L120 Bullous pemphigoid: Secondary | ICD-10-CM | POA: Diagnosis not present

## 2022-04-03 DIAGNOSIS — J3489 Other specified disorders of nose and nasal sinuses: Secondary | ICD-10-CM | POA: Diagnosis not present

## 2022-04-08 DIAGNOSIS — L120 Bullous pemphigoid: Secondary | ICD-10-CM | POA: Diagnosis not present

## 2022-04-08 DIAGNOSIS — Z79899 Other long term (current) drug therapy: Secondary | ICD-10-CM | POA: Diagnosis not present

## 2022-04-08 DIAGNOSIS — Z5181 Encounter for therapeutic drug level monitoring: Secondary | ICD-10-CM | POA: Diagnosis not present

## 2022-04-08 DIAGNOSIS — Z8616 Personal history of COVID-19: Secondary | ICD-10-CM | POA: Diagnosis not present

## 2022-05-07 DIAGNOSIS — Z5181 Encounter for therapeutic drug level monitoring: Secondary | ICD-10-CM | POA: Diagnosis not present

## 2022-05-20 DIAGNOSIS — L120 Bullous pemphigoid: Secondary | ICD-10-CM | POA: Diagnosis not present

## 2022-05-20 DIAGNOSIS — Z5181 Encounter for therapeutic drug level monitoring: Secondary | ICD-10-CM | POA: Diagnosis not present

## 2022-05-20 DIAGNOSIS — L720 Epidermal cyst: Secondary | ICD-10-CM | POA: Diagnosis not present

## 2022-07-01 DIAGNOSIS — L720 Epidermal cyst: Secondary | ICD-10-CM | POA: Diagnosis not present

## 2022-07-01 DIAGNOSIS — Z5181 Encounter for therapeutic drug level monitoring: Secondary | ICD-10-CM | POA: Diagnosis not present

## 2022-07-01 DIAGNOSIS — L120 Bullous pemphigoid: Secondary | ICD-10-CM | POA: Diagnosis not present

## 2022-07-01 DIAGNOSIS — M545 Low back pain, unspecified: Secondary | ICD-10-CM | POA: Diagnosis not present

## 2022-07-15 DIAGNOSIS — J3489 Other specified disorders of nose and nasal sinuses: Secondary | ICD-10-CM | POA: Diagnosis not present

## 2022-07-15 DIAGNOSIS — J310 Chronic rhinitis: Secondary | ICD-10-CM | POA: Diagnosis not present

## 2022-07-15 DIAGNOSIS — J343 Hypertrophy of nasal turbinates: Secondary | ICD-10-CM | POA: Diagnosis not present

## 2022-07-15 DIAGNOSIS — J342 Deviated nasal septum: Secondary | ICD-10-CM | POA: Diagnosis not present

## 2022-09-01 DIAGNOSIS — L120 Bullous pemphigoid: Secondary | ICD-10-CM | POA: Diagnosis not present

## 2022-09-01 DIAGNOSIS — Z5181 Encounter for therapeutic drug level monitoring: Secondary | ICD-10-CM | POA: Diagnosis not present

## 2022-10-14 DIAGNOSIS — E039 Hypothyroidism, unspecified: Secondary | ICD-10-CM | POA: Diagnosis not present

## 2022-10-14 DIAGNOSIS — Z23 Encounter for immunization: Secondary | ICD-10-CM | POA: Diagnosis not present

## 2022-10-14 DIAGNOSIS — L109 Pemphigus, unspecified: Secondary | ICD-10-CM | POA: Diagnosis not present

## 2022-10-14 DIAGNOSIS — R7309 Other abnormal glucose: Secondary | ICD-10-CM | POA: Diagnosis not present

## 2022-10-14 DIAGNOSIS — I10 Essential (primary) hypertension: Secondary | ICD-10-CM | POA: Diagnosis not present

## 2022-11-25 DIAGNOSIS — Z5181 Encounter for therapeutic drug level monitoring: Secondary | ICD-10-CM | POA: Diagnosis not present

## 2022-11-25 DIAGNOSIS — L120 Bullous pemphigoid: Secondary | ICD-10-CM | POA: Diagnosis not present

## 2023-01-15 DIAGNOSIS — I10 Essential (primary) hypertension: Secondary | ICD-10-CM | POA: Diagnosis not present

## 2023-01-15 DIAGNOSIS — L109 Pemphigus, unspecified: Secondary | ICD-10-CM | POA: Diagnosis not present

## 2023-01-15 DIAGNOSIS — E039 Hypothyroidism, unspecified: Secondary | ICD-10-CM | POA: Diagnosis not present

## 2023-01-15 DIAGNOSIS — Z79899 Other long term (current) drug therapy: Secondary | ICD-10-CM | POA: Diagnosis not present

## 2023-02-23 NOTE — Progress Notes (Deleted)
     Buzzy Han, NP Reason for referral-hypertension  HPI: 67 year old female for evaluation of hypertension at request of Moshe Cipro, NP.  Previously followed by Dr. Tomie China but not since September 2020.  Echocardiogram September 2020 showed normal LV function, mild tricuspid regurgitation.  Calcium score November 2020 29 which was 72 percentile.  Cardiology now asked to evaluate.  Current Outpatient Medications  Medication Sig Dispense Refill   lisinopril-hydrochlorothiazide (ZESTORETIC) 20-25 MG tablet Take 1 tablet by mouth daily.     thyroid (ARMOUR) 60 MG tablet Take 60 mg by mouth daily before breakfast.     No current facility-administered medications for this visit.    No Known Allergies   Past Medical History:  Diagnosis Date   Hypertension    Hypertrophy of vulva 07/04/2016   Hypothyroidism    Osteoarthritis    Unspecified   Seasonal allergies     Past Surgical History:  Procedure Laterality Date   TONSILLECTOMY      Social History   Socioeconomic History   Marital status: Single    Spouse name: Not on file   Number of children: Not on file   Years of education: Not on file   Highest education level: Not on file  Occupational History   Not on file  Tobacco Use   Smoking status: Former    Types: Cigarettes    Quit date: 07/06/2009    Years since quitting: 13.6   Smokeless tobacco: Never  Substance and Sexual Activity   Alcohol use: Not on file   Drug use: Not on file   Sexual activity: Not on file  Other Topics Concern   Not on file  Social History Narrative   Not on file   Social Determinants of Health   Financial Resource Strain: Not on file  Food Insecurity: Not on file  Transportation Needs: Not on file  Physical Activity: Not on file  Stress: Not on file  Social Connections: Not on file  Intimate Partner Violence: Not on file    Family History  Problem Relation Age of Onset   CAD Mother    Hypertension  Mother    Renal cancer Father    Diabetes Brother    Renal Disease Brother    CAD Brother    Heart attack Brother     ROS: no fevers or chills, productive cough, hemoptysis, dysphasia, odynophagia, melena, hematochezia, dysuria, hematuria, rash, seizure activity, orthopnea, PND, pedal edema, claudication. Remaining systems are negative.  Physical Exam:   There were no vitals taken for this visit.  General:  Well developed/well nourished in NAD Skin warm/dry Patient not depressed No peripheral clubbing Back-normal HEENT-normal/normal eyelids Neck supple/normal carotid upstroke bilaterally; no bruits; no JVD; no thyromegaly chest - CTA/ normal expansion CV - RRR/normal S1 and S2; no murmurs, rubs or gallops;  PMI nondisplaced Abdomen -NT/ND, no HSM, no mass, + bowel sounds, no bruit 2+ femoral pulses, no bruits Ext-no edema, chords, 2+ DP Neuro-grossly nonfocal  ECG - personally reviewed  A/P  1 coronary calcification-  2 hyperlipidemia-  3 hypertension-  Olga Millers, MD

## 2023-03-04 ENCOUNTER — Ambulatory Visit: Payer: BC Managed Care – PPO | Admitting: Cardiology

## 2023-05-13 NOTE — Progress Notes (Signed)
Buzzy Han, NP Reason for referral-hypertension  HPI: 67 year old female for evaluation of hypertension at request of Moshe Cipro, NP.  Previously seen by Dr. Tomie China but not since September 2020.  Echocardiogram September 2020 showed normal LV function, mild tricuspid regurgitation.  Calcium score November 2020 29 which was 72nd percentile.  Laboratories April 2024 showed creatinine 0.54, BUN 17, sodium 140, potassium 4.8 and normal TSH.  Patient is considering initiating an exercise program for weight loss.  She presented for evaluation of her risk factors.  She has dyspnea with more vigorous activities.  There is no orthopnea, PND, pedal edema, chest pain, palpitations or syncope.  Current Outpatient Medications  Medication Sig Dispense Refill   B Complex Vitamins (VITAMIN B COMPLEX PO) Vitamin B Complex     Dupilumab (DUPIXENT) 300 MG/2ML SOPN Inject 300 mg into the skin.     lisinopril-hydrochlorothiazide (ZESTORETIC) 20-25 MG tablet Take 1 tablet by mouth daily.     Multiple Vitamin (MULTI VITAMIN PO) Multi Vitamin     thyroid (ARMOUR) 60 MG tablet Take 60 mg by mouth daily before breakfast.     No current facility-administered medications for this visit.    Allergies  Allergen Reactions   Amoxicillin Rash     Past Medical History:  Diagnosis Date   BMI 50.0-59.9, adult (HCC)    Bullous pemphigus    Family history of diabetes mellitus (DM)    Hypertension    Hypertrophy of vulva 07/04/2016   Hypothyroidism    Morbid obesity with BMI of 50.0-59.9, adult (HCC)    Osteoarthritis    Unspecified   Seasonal allergies     Past Surgical History:  Procedure Laterality Date   TONSILLECTOMY      Social History   Socioeconomic History   Marital status: Single    Spouse name: Not on file   Number of children: Not on file   Years of education: Not on file   Highest education level: Not on file  Occupational History   Not on file  Tobacco  Use   Smoking status: Former    Current packs/day: 0.00    Types: Cigarettes    Quit date: 10/20/1986    Years since quitting: 36.6   Smokeless tobacco: Never  Substance and Sexual Activity   Alcohol use: Yes    Alcohol/week: 3.0 standard drinks of alcohol    Types: 3 Glasses of wine per week    Comment: Occasional   Drug use: Never   Sexual activity: Yes  Other Topics Concern   Not on file  Social History Narrative   Not on file   Social Determinants of Health   Financial Resource Strain: Not on file  Food Insecurity: Not on file  Transportation Needs: Not on file  Physical Activity: Not on file  Stress: Not on file  Social Connections: Not on file  Intimate Partner Violence: Not on file    Family History  Problem Relation Age of Onset   CAD Mother    Hypertension Mother    Renal cancer Father    Diabetes Brother    Renal Disease Brother    CAD Brother    Heart attack Brother     ROS: Arthralgias but no fevers or chills, productive cough, hemoptysis, dysphasia, odynophagia, melena, hematochezia, dysuria, hematuria, rash, seizure activity, orthopnea, PND, pedal edema, claudication. Remaining systems are negative.  Physical Exam:   Blood pressure 126/60, pulse 93, height 5\' 3"  (1.6 m), weight 290 lb (131.5 kg),  SpO2 98%.  General:  Well developed/morbidly obese in NAD Skin warm/dry Patient not depressed No peripheral clubbing Back-normal HEENT-normal/normal eyelids Neck supple/normal carotid upstroke bilaterally; no bruits; no JVD; no thyromegaly chest - CTA/ normal expansion CV - RRR/normal S1 and S2; no murmurs, rubs or gallops;  PMI nondisplaced Abdomen -NT/ND, no HSM, no mass, + bowel sounds, no bruit 2+ femoral pulses, no bruits Ext-no edema, chords, 2+ DP Neuro-grossly nonfocal  EKG Interpretation Date/Time:  Wednesday May 27 2023 08:01:15 EDT Ventricular Rate:  93 PR Interval:  142 QRS Duration:  70 QT Interval:  354 QTC Calculation: 440 R  Axis:   8  Text Interpretation: Normal sinus rhythm Low voltage QRS Inferior infarct , age undetermined No previous ECGs available Confirmed by Olga Millers ([id number]) on 05/27/2023 8:05:23 AM    A/P  1 hypertension-patient's blood pressure is controlled with present regimen.  Will continue and monitor.  2 coronary calcification-previously noted to have coronary calcium.  Will initiate Crestor 20 mg daily.  Check lipids and liver in 8 weeks.  Patient denies chest pain.  3 morbid obesity-we discussed the importance of weight loss.  She is planning on initiating a program.  4 abnormal ECG-cannot rule out prior inferior infarct on today's electrocardiogram.  Will arrange echocardiogram to assess wall motion.  If normal we will not pursue further ischemia evaluation.  Olga Millers, MD

## 2023-05-14 ENCOUNTER — Other Ambulatory Visit: Payer: Self-pay

## 2023-05-27 ENCOUNTER — Encounter: Payer: Self-pay | Admitting: Cardiology

## 2023-05-27 ENCOUNTER — Ambulatory Visit: Payer: BC Managed Care – PPO | Attending: Cardiology | Admitting: Cardiology

## 2023-05-27 VITALS — BP 126/60 | HR 93 | Ht 63.0 in | Wt 290.0 lb

## 2023-05-27 DIAGNOSIS — E785 Hyperlipidemia, unspecified: Secondary | ICD-10-CM

## 2023-05-27 DIAGNOSIS — I10 Essential (primary) hypertension: Secondary | ICD-10-CM

## 2023-05-27 DIAGNOSIS — R9431 Abnormal electrocardiogram [ECG] [EKG]: Secondary | ICD-10-CM | POA: Diagnosis not present

## 2023-05-27 MED ORDER — ROSUVASTATIN CALCIUM 20 MG PO TABS
20.0000 mg | ORAL_TABLET | Freq: Every day | ORAL | 3 refills | Status: DC
Start: 2023-05-27 — End: 2024-06-23

## 2023-05-27 NOTE — Patient Instructions (Signed)
Medication Instructions:   START ROSUVASTATIN 20 MG ONCE DAILY  *If you need a refill on your cardiac medications before your next appointment, please call your pharmacy*   Lab Work:  Your physician recommends that you return for lab work in: 8 [facility]  If you have labs (blood work) drawn today and your tests are completely normal, you will receive your results only by: MyChart Message (if you have MyChart) OR A paper copy in the mail If you have any lab test that is abnormal or we need to change your treatment, we will call you to review the results.   Testing/Procedures:  Your physician has requested that you have an echocardiogram. Echocardiography is a painless test that uses sound waves to create images of your heart. It provides your doctor with information about the size and shape of your heart and how well your heart's chambers and valves are working. This procedure takes approximately one hour. There are no restrictions for this procedure. Please do NOT wear cologne, perfume, aftershave, or lotions (deodorant is allowed). Please arrive 15 minutes prior to your appointment time. [place] OFFICE-1ST FLOOR IMAGING DEPARTMENT   Follow-Up: At [facility], you and your health needs are our priority.  As part of our continuing mission to provide you with exceptional heart care, we have created designated Provider Care Teams.  These Care Teams include your primary Cardiologist (physician) and Advanced Practice Providers (APPs -  Physician Assistants and Nurse Practitioners) who all work together to provide you with the care you need, when you need it.  We recommend signing up for the patient portal called "MyChart".  Sign up information is provided on this After Visit Summary.  MyChart is used to connect with patients for Virtual Visits (Telemedicine).  Patients are able to view lab/test results, encounter notes, upcoming appointments, etc.  Non-urgent messages can be  sent to your provider as well.   To learn more about what you can do with MyChart, go to [email or web address].    Your next appointment:   12 month(s)  Provider:   Olga Millers, MD

## 2023-06-18 ENCOUNTER — Ambulatory Visit (HOSPITAL_BASED_OUTPATIENT_CLINIC_OR_DEPARTMENT_OTHER): Payer: BC Managed Care – PPO

## 2023-06-30 DIAGNOSIS — L120 Bullous pemphigoid: Secondary | ICD-10-CM | POA: Diagnosis not present

## 2023-06-30 DIAGNOSIS — Z5181 Encounter for therapeutic drug level monitoring: Secondary | ICD-10-CM | POA: Diagnosis not present

## 2023-06-30 DIAGNOSIS — D369 Benign neoplasm, unspecified site: Secondary | ICD-10-CM | POA: Diagnosis not present

## 2023-08-07 ENCOUNTER — Other Ambulatory Visit (HOSPITAL_BASED_OUTPATIENT_CLINIC_OR_DEPARTMENT_OTHER): Payer: Self-pay | Admitting: Family Medicine

## 2023-08-07 ENCOUNTER — Telehealth (HOSPITAL_BASED_OUTPATIENT_CLINIC_OR_DEPARTMENT_OTHER): Payer: Self-pay

## 2023-08-07 DIAGNOSIS — Z1231 Encounter for screening mammogram for malignant neoplasm of breast: Secondary | ICD-10-CM | POA: Diagnosis not present

## 2023-08-07 DIAGNOSIS — Z13820 Encounter for screening for osteoporosis: Secondary | ICD-10-CM

## 2023-08-07 DIAGNOSIS — I10 Essential (primary) hypertension: Secondary | ICD-10-CM | POA: Diagnosis not present

## 2023-08-07 DIAGNOSIS — Z23 Encounter for immunization: Secondary | ICD-10-CM | POA: Diagnosis not present

## 2023-08-07 DIAGNOSIS — Z1211 Encounter for screening for malignant neoplasm of colon: Secondary | ICD-10-CM | POA: Diagnosis not present

## 2023-08-07 DIAGNOSIS — E039 Hypothyroidism, unspecified: Secondary | ICD-10-CM | POA: Diagnosis not present

## 2023-08-18 ENCOUNTER — Ambulatory Visit (HOSPITAL_BASED_OUTPATIENT_CLINIC_OR_DEPARTMENT_OTHER)
Admission: RE | Admit: 2023-08-18 | Discharge: 2023-08-18 | Disposition: A | Discharge status: Home / Self Care | Payer: BC Managed Care – PPO | Source: Ambulatory Visit | Attending: Cardiology | Admitting: Cardiology

## 2023-08-18 ENCOUNTER — Encounter (HOSPITAL_BASED_OUTPATIENT_CLINIC_OR_DEPARTMENT_OTHER): Payer: Self-pay

## 2023-08-18 ENCOUNTER — Ambulatory Visit (HOSPITAL_BASED_OUTPATIENT_CLINIC_OR_DEPARTMENT_OTHER)
Admission: RE | Admit: 2023-08-18 | Discharge: 2023-08-18 | Disposition: A | Discharge status: Home / Self Care | Payer: BC Managed Care – PPO | Source: Ambulatory Visit | Attending: Family Medicine | Admitting: Family Medicine

## 2023-08-18 DIAGNOSIS — Z780 Asymptomatic menopausal state: Secondary | ICD-10-CM | POA: Diagnosis not present

## 2023-08-18 DIAGNOSIS — Z1231 Encounter for screening mammogram for malignant neoplasm of breast: Secondary | ICD-10-CM | POA: Diagnosis not present

## 2023-08-18 DIAGNOSIS — Z13820 Encounter for screening for osteoporosis: Secondary | ICD-10-CM

## 2023-08-18 DIAGNOSIS — R9431 Abnormal electrocardiogram [ECG] [EKG]: Secondary | ICD-10-CM | POA: Insufficient documentation

## 2023-08-18 DIAGNOSIS — M85852 Other specified disorders of bone density and structure, left thigh: Secondary | ICD-10-CM | POA: Diagnosis not present

## 2023-08-18 LAB — ECHOCARDIOGRAM COMPLETE
AR max vel: 1.93 cm2
AV Area VTI: 2.02 cm2
AV Area mean vel: 1.95 cm2
AV Mean grad: 4 mm[Hg]
AV Peak grad: 7.8 mm[Hg]
Ao pk vel: 1.4 m/s
Area-P 1/2: 4.57 cm2
Calc EF: 60.3 %
S' Lateral: 2.8 cm
Single Plane A2C EF: 62 %
Single Plane A4C EF: 60 %

## 2023-08-25 ENCOUNTER — Encounter: Payer: Self-pay | Admitting: *Deleted

## 2023-08-31 DIAGNOSIS — Z1211 Encounter for screening for malignant neoplasm of colon: Secondary | ICD-10-CM | POA: Diagnosis not present

## 2023-08-31 DIAGNOSIS — Z1212 Encounter for screening for malignant neoplasm of rectum: Secondary | ICD-10-CM | POA: Diagnosis not present

## 2024-01-04 DIAGNOSIS — L729 Follicular cyst of the skin and subcutaneous tissue, unspecified: Secondary | ICD-10-CM | POA: Diagnosis not present

## 2024-01-04 DIAGNOSIS — L120 Bullous pemphigoid: Secondary | ICD-10-CM | POA: Diagnosis not present

## 2024-01-04 DIAGNOSIS — Z5181 Encounter for therapeutic drug level monitoring: Secondary | ICD-10-CM | POA: Diagnosis not present

## 2024-03-10 DIAGNOSIS — Z0000 Encounter for general adult medical examination without abnormal findings: Secondary | ICD-10-CM | POA: Diagnosis not present

## 2024-03-10 DIAGNOSIS — E039 Hypothyroidism, unspecified: Secondary | ICD-10-CM | POA: Diagnosis not present

## 2024-03-10 DIAGNOSIS — I10 Essential (primary) hypertension: Secondary | ICD-10-CM | POA: Diagnosis not present

## 2024-03-10 DIAGNOSIS — I2510 Atherosclerotic heart disease of native coronary artery without angina pectoris: Secondary | ICD-10-CM | POA: Diagnosis not present

## 2024-03-10 DIAGNOSIS — Z23 Encounter for immunization: Secondary | ICD-10-CM | POA: Diagnosis not present

## 2024-03-10 DIAGNOSIS — Z131 Encounter for screening for diabetes mellitus: Secondary | ICD-10-CM | POA: Diagnosis not present

## 2024-06-23 ENCOUNTER — Telehealth: Payer: Self-pay | Admitting: Cardiology

## 2024-06-23 DIAGNOSIS — E785 Hyperlipidemia, unspecified: Secondary | ICD-10-CM

## 2024-06-23 MED ORDER — ROSUVASTATIN CALCIUM 20 MG PO TABS: 20.0000 mg | ORAL_TABLET | Freq: Every day | ORAL | 0 refills | Status: DC

## 2024-06-23 NOTE — Telephone Encounter (Signed)
 *  STAT* If patient is at the pharmacy, call can be transferred to refill team.   1. Which medications need to be refilled? (please list name of each medication and dose if known) rosuvastatin  (CRESTOR ) 20 MG tablet    2. Would you like to learn more about the convenience, safety, & potential cost savings by using the [facility]? No      3. Are you open to using the [facility] (Type [facility]. ). No    4. Which pharmacy/location (including street and city if local pharmacy) is medication to be sent to?  [id number] - [place], [place] - [address] AT [facility] [address]     5. Do they need a 30 day or 90 day supply? 90 days   Patient made an appt with Dr. Pietro on 08/29/24 at 4 pm

## 2024-06-23 NOTE — Telephone Encounter (Signed)
 RX sent in

## 2024-07-18 DIAGNOSIS — L120 Bullous pemphigoid: Secondary | ICD-10-CM | POA: Diagnosis not present

## 2024-07-18 DIAGNOSIS — Z5181 Encounter for therapeutic drug level monitoring: Secondary | ICD-10-CM | POA: Diagnosis not present

## 2024-08-19 NOTE — Progress Notes (Deleted)
 HPI: FU hypertension.  Calcium  score November 2020 29 which was 72nd percentile.  Echo 11/24 showed normal LV function. Since last seen,   Current Outpatient Medications  Medication Sig Dispense Refill   B Complex Vitamins (VITAMIN B COMPLEX PO) Vitamin B Complex     Dupilumab (DUPIXENT) 300 MG/2ML SOPN Inject 300 mg into the skin.     lisinopril-hydrochlorothiazide (ZESTORETIC) 20-25 MG tablet Take 1 tablet by mouth daily.     Multiple Vitamin (MULTI VITAMIN PO) Multi Vitamin     rosuvastatin  (CRESTOR ) 20 MG tablet Take 1 tablet (20 mg total) by mouth daily. 90 tablet 0   thyroid  (ARMOUR) 60 MG tablet Take 60 mg by mouth daily before breakfast.     No current facility-administered medications for this visit.    Allergies  Allergen Reactions   Amoxicillin Rash     Past Medical History:  Diagnosis Date   BMI 50.0-59.9, adult (HCC)    Bullous pemphigus    Family history of diabetes mellitus (DM)    Hypertension    Hypertrophy of vulva 07/04/2016   Hypothyroidism    Morbid obesity with BMI of 50.0-59.9, adult (HCC)    Osteoarthritis    Unspecified   Seasonal allergies     Past Surgical History:  Procedure Laterality Date   TONSILLECTOMY      Social History   Socioeconomic History   Marital status: Single    Spouse name: Not on file   Number of children: Not on file   Years of education: Not on file   Highest education level: Not on file  Occupational History   Not on file  Tobacco Use   Smoking status: Former    Current packs/day: 0.00    Types: Cigarettes    Quit date: 10/20/1986    Years since quitting: 37.8   Smokeless tobacco: Never  Substance and Sexual Activity   Alcohol use: Yes    Alcohol/week: 3.0 standard drinks of alcohol    Types: 3 Glasses of wine per week    Comment: Occasional   Drug use: Never   Sexual activity: Yes  Other Topics Concern   Not on file  Social History Narrative   Not on file   Social Drivers of Health   Financial  Resource Strain: Not on file  Food Insecurity: Not on file  Transportation Needs: Not on file  Physical Activity: Not on file  Stress: Not on file  Social Connections: Not on file  Intimate Partner Violence: Not on file    Family History  Problem Relation Age of Onset   CAD Mother    Hypertension Mother    Renal cancer Father    Diabetes Brother    Renal Disease Brother    CAD Brother    Heart attack Brother     ROS: Arthralgias but no fevers or chills, productive cough, hemoptysis, dysphasia, odynophagia, melena, hematochezia, dysuria, hematuria, rash, seizure activity, orthopnea, PND, pedal edema, claudication. Remaining systems are negative.  Physical Exam:   There were no vitals taken for this visit.  General:  Well developed/morbidly obese in NAD Skin warm/dry Patient not depressed No peripheral clubbing Back-normal HEENT-normal/normal eyelids Neck supple/normal carotid upstroke bilaterally; no bruits; no JVD; no thyromegaly chest - CTA/ normal expansion CV - RRR/normal S1 and S2; no murmurs, rubs or gallops;  PMI nondisplaced Abdomen -NT/ND, no HSM, no mass, + bowel sounds, no bruit 2+ femoral pulses, no bruits Ext-no edema, chords, 2+ DP Neuro-grossly nonfocal  A/P  1 hypertension-BP controlled; continue present meds.   2 coronary calcification-previously noted to have coronary calcium .  Continue statin; denies CP.   3 morbid obesity-we discussed the importance of weight loss.    Redell Shallow, MD

## 2024-08-29 ENCOUNTER — Ambulatory Visit: Admitting: Cardiology

## 2024-09-19 ENCOUNTER — Other Ambulatory Visit: Payer: Self-pay | Admitting: Cardiology

## 2024-09-19 DIAGNOSIS — E785 Hyperlipidemia, unspecified: Secondary | ICD-10-CM

## 2024-10-21 NOTE — Progress Notes (Unsigned)
 "    HPI: Follow-up hypertension. Echocardiogram September 2020 showed normal LV function, mild tricuspid regurgitation. Calcium  score November 2020 29 which was 72nd percentile.  Echocardiogram November 2024 showed normal LV function.  Since last seen  Current Outpatient Medications  Medication Sig Dispense Refill   B Complex Vitamins (VITAMIN B COMPLEX PO) Vitamin B Complex     Dupilumab (DUPIXENT) 300 MG/2ML SOPN Inject 300 mg into the skin.     lisinopril-hydrochlorothiazide (ZESTORETIC) 20-25 MG tablet Take 1 tablet by mouth daily.     Multiple Vitamin (MULTI VITAMIN PO) Multi Vitamin     rosuvastatin  (CRESTOR ) 20 MG tablet TAKE 1 TABLET(20 MG) BY MOUTH DAILY 30 tablet 1   thyroid  (ARMOUR) 60 MG tablet Take 60 mg by mouth daily before breakfast.     No current facility-administered medications for this visit.     Past Medical History:  Diagnosis Date   BMI 50.0-59.9, adult (HCC)    Bullous pemphigus    Family history of diabetes mellitus (DM)    Hypertension    Hypertrophy of vulva 07/04/2016   Hypothyroidism    Morbid obesity with BMI of 50.0-59.9, adult (HCC)    Osteoarthritis    Unspecified   Seasonal allergies     Past Surgical History:  Procedure Laterality Date   TONSILLECTOMY      Social History   Socioeconomic History   Marital status: Single    Spouse name: Not on file   Number of children: Not on file   Years of education: Not on file   Highest education level: Not on file  Occupational History   Not on file  Tobacco Use   Smoking status: Former    Current packs/day: 0.00    Types: Cigarettes    Quit date: 10/20/1986    Years since quitting: 38.0   Smokeless tobacco: Never  Substance and Sexual Activity   Alcohol use: Yes    Alcohol/week: 3.0 standard drinks of alcohol    Types: 3 Glasses of wine per week    Comment: Occasional   Drug use: Never   Sexual activity: Yes  Other Topics Concern   Not on file  Social History Narrative   Not on  file   Social Drivers of Health   Tobacco Use: Medium Risk (08/18/2023)   Patient History    Smoking Tobacco Use: Former    Smokeless Tobacco Use: Never    Passive Exposure: Not on Actuary Strain: Not on file  Food Insecurity: Not on file  Transportation Needs: Not on file  Physical Activity: Not on file  Stress: Not on file  Social Connections: Not on file  Intimate Partner Violence: Not on file  Depression (EYV7-0): Not on file  Alcohol Screen: Not on file  Housing: Unknown (01/03/2024)   [facility]   Epic    Unable to Pay for Housing in the Last Year: Not on file    Number of Times Moved in the Last Year: Not on file    At any time in the past 12 months, were you homeless or living in a shelter (including now)?: No  Utilities: Not on file  Health Literacy: Not on file    Family History  Problem Relation Age of Onset   CAD Mother    Hypertension Mother    Renal cancer Father    Diabetes Brother    Renal Disease Brother    CAD Brother    Heart attack  Brother     ROS: no fevers or chills, productive cough, hemoptysis, dysphasia, odynophagia, melena, hematochezia, dysuria, hematuria, rash, seizure activity, orthopnea, PND, pedal edema, claudication. Remaining systems are negative.  Physical Exam: Well-developed well-nourished in no acute distress.  Skin is warm and dry.  HEENT is normal.  Neck is supple.  Chest is clear to auscultation with normal expansion.  Cardiovascular exam is regular rate and rhythm.  Abdominal exam nontender or distended. No masses palpated. Extremities show no edema. neuro grossly intact  ECG- personally reviewed  A/P  1 coronary calcification-previously noted to have coronary calcification.  Continue statin.  He is not having chest pain.  2 hypertension-blood pressure controlled.  Continue present medications.  3 morbid obesity-we discussed the importance of weight loss.  Redell Shallow, MD    "

## 2024-10-31 ENCOUNTER — Ambulatory Visit: Admitting: Cardiology

## 2024-12-08 ENCOUNTER — Ambulatory Visit: Admitting: Cardiology
